# Patient Record
Sex: Female | Born: 2000
Health system: Southern US, Community
[De-identification: ages and names within clinical notes are randomized; demographics above are authoritative.]

## PROBLEM LIST (undated history)

## (undated) DIAGNOSIS — U071 COVID-19: Secondary | ICD-10-CM

## (undated) DIAGNOSIS — R42 Dizziness and giddiness: Secondary | ICD-10-CM

## (undated) DIAGNOSIS — T7840XA Allergy, unspecified, initial encounter: Secondary | ICD-10-CM

## (undated) DIAGNOSIS — L700 Acne vulgaris: Secondary | ICD-10-CM

## (undated) HISTORY — PX: WISDOM TOOTH EXTRACTION: SHX21

## (undated) HISTORY — DX: COVID-19: U07.1

## (undated) HISTORY — DX: Dizziness and giddiness: R42

## (undated) HISTORY — DX: Allergy, unspecified, initial encounter: T78.40XA

## (undated) HISTORY — DX: Acne vulgaris: L70.0

---
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
## 2018-04-15 DIAGNOSIS — L700 Acne vulgaris: Secondary | ICD-10-CM | POA: Diagnosis not present

## 2018-05-06 ENCOUNTER — Ambulatory Visit: Payer: 59 | Admitting: Internal Medicine

## 2018-05-06 ENCOUNTER — Encounter: Payer: Self-pay | Admitting: Internal Medicine

## 2018-05-06 VITALS — BP 110/68 | HR 95 | Temp 98.7°F | Ht 65.25 in | Wt 184.4 lb

## 2018-05-06 DIAGNOSIS — Z1159 Encounter for screening for other viral diseases: Secondary | ICD-10-CM | POA: Diagnosis not present

## 2018-05-06 DIAGNOSIS — E559 Vitamin D deficiency, unspecified: Secondary | ICD-10-CM

## 2018-05-06 DIAGNOSIS — Z3041 Encounter for surveillance of contraceptive pills: Secondary | ICD-10-CM

## 2018-05-06 DIAGNOSIS — Z1329 Encounter for screening for other suspected endocrine disorder: Secondary | ICD-10-CM | POA: Diagnosis not present

## 2018-05-06 DIAGNOSIS — L700 Acne vulgaris: Secondary | ICD-10-CM | POA: Diagnosis not present

## 2018-05-06 DIAGNOSIS — J309 Allergic rhinitis, unspecified: Secondary | ICD-10-CM | POA: Insufficient documentation

## 2018-05-06 DIAGNOSIS — R739 Hyperglycemia, unspecified: Secondary | ICD-10-CM | POA: Diagnosis not present

## 2018-05-06 DIAGNOSIS — E669 Obesity, unspecified: Secondary | ICD-10-CM | POA: Insufficient documentation

## 2018-05-06 DIAGNOSIS — Z0184 Encounter for antibody response examination: Secondary | ICD-10-CM

## 2018-05-06 DIAGNOSIS — Z1389 Encounter for screening for other disorder: Secondary | ICD-10-CM

## 2018-05-06 DIAGNOSIS — Z0000 Encounter for general adult medical examination without abnormal findings: Secondary | ICD-10-CM

## 2018-05-06 MED ORDER — TRI-LO-MARZIA 0.18/0.215/0.25 MG-25 MCG PO TABS: 1.0000 | ORAL_TABLET | Freq: Every day | ORAL | 11 refills | Status: DC

## 2018-05-06 NOTE — Progress Notes (Addendum)
Chief Complaint  Patient presents with  . New Patient (Initial Visit)   New patient with grandmother Iantha Fallen today  1. She has h/o allergies she knows she is allergic to grass but has never been allergy tested and grandmother and pt report she will get nasal congestion, ears congested and post nasal drip. She will try to hold nose and blow to help ear sx's. She is not currently taking allergy medication but has taken Zyrtec and Claritin in the past  2. Acne she has completed 6 months of accutane x 1 round last summer dermatologist is Dr. Claris Pong skin now she is on Tri Lo Robbie Louis for acne Rx by dermatology which is helping  3. Wisdom teeth recently 2019 removed and left posterior wisdom tooth area with whole in the gum and dentist told pt to rinse mouth with warm salt which she has been doing.     Review of Systems  Constitutional: Negative for weight loss.  HENT: Positive for congestion. Negative for ear pain and hearing loss.        +nasal and ear congestion   Eyes: Negative for blurred vision.  Respiratory: Negative for shortness of breath.   Cardiovascular: Negative for chest pain.  Gastrointestinal: Negative for abdominal pain and blood in stool.  Musculoskeletal: Negative for joint pain.  Skin: Negative for rash.       +acne improved   Neurological: Negative for dizziness.  Endo/Heme/Allergies: Positive for environmental allergies.  Psychiatric/Behavioral: Negative for depression.   Past Medical History:  Diagnosis Date  . Acne vulgaris    completed 6 months of Accutane summer 2018 x 1 round   . Allergy   . Vertigo    2017   Past Surgical History:  Procedure Laterality Date  . WISDOM TOOTH EXTRACTION     all 4 in 2019    Family History  Problem Relation Age of Onset  . Cancer Maternal Grandfather        smoker lung cancer   . Heart disease Maternal Grandfather        MI/CABG  . Dementia Maternal Grandfather    Social History   Socioeconomic  History  . Marital status: Single    Spouse name: Not on file  . Number of children: Not on file  . Years of education: Not on file  . Highest education level: Not on file  Occupational History  . Not on file  Social Needs  . Financial resource strain: Not on file  . Food insecurity:    Worry: Not on file    Inability: Not on file  . Transportation needs:    Medical: Not on file    Non-medical: Not on file  Tobacco Use  . Smoking status: Never Smoker  . Smokeless tobacco: Never Used  Substance and Sexual Activity  . Alcohol use: No  . Drug use: Not Currently  . Sexual activity: Not on file  Lifestyle  . Physical activity:    Days per week: Not on file    Minutes per session: Not on file  . Stress: Not on file  Relationships  . Social connections:    Talks on phone: Not on file    Gets together: Not on file    Attends religious service: Not on file    Active member of club or organization: Not on file    Attends meetings of clubs or organizations: Not on file    Relationship status: Not on file  . Intimate partner violence:  Fear of current or ex partner: Not on file    Emotionally abused: Not on file    Physically abused: Not on file    Forced sexual activity: Not on file  Other Topics Concern  . 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 TRI-LO-MARZIA 0.18/0.215/0.25 MG-25 MCG tab Take 1 tablet by mouth daily. As directed  . [DISCONTINUED] TRI-LO-MARZIA 0.18/0.215/0.25 MG-25 MCG tab Take 1 tablet by mouth daily.   No Known Allergies No results found for this or any previous visit (from the past 2160 hour(s)). Objective  Body mass index is 30.45 kg/m. Wt Readings from Last 3 Encounters:  05/06/18 184 lb 6.4 oz (83.6 kg) (96 %, Z= 1.70)*  08/31/16 160 lb (72.6 kg) (90 %, Z= 1.29)*    * Growth percentiles are based on CDC (Girls, 2-20 Years) data.   Temp Readings from Last 3 Encounters:  05/06/18 98.7 F (37.1 C) (Oral)  09/01/16 97.8 F (36.6 C) (Oral)   BP Readings from Last 3 Encounters:  05/06/18 110/68  09/01/16 (!) 93/57 (2 %, Z = -2.05 /  14 %, Z = -1.08)*   *BP percentiles are based on the August 2017 AAP Clinical Practice Guideline for girls   Pulse Readings from Last 3 Encounters:  05/06/18 95  09/01/16 81    Physical Exam  Constitutional: She is oriented to person, place, and time. Vital signs are normal. She appears well-developed and well-nourished. She is cooperative.  HENT:  Head: Normocephalic and atraumatic.  Right Ear: Hearing and tympanic membrane normal.  Left Ear: Hearing and tympanic membrane normal.  Mouth/Throat: Oropharynx is clear and moist and mucous membranes are normal.  Mild erythema b/l ears w/o pain pt reports cleans ears out with Qtips   Eyes: Pupils are equal, round, and reactive to light. Conjunctivae are normal.  Cardiovascular: Normal rate, regular rhythm and normal heart sounds.  Pulmonary/Chest: Effort normal and breath sounds normal.  Abdominal: Soft. Normal appearance and bowel sounds are normal. There is no tenderness.  Neurological: She is alert and oriented to person, place, and time. Gait normal.  Skin: Skin is warm, dry and intact.  Psychiatric: She has a normal mood and affect. Her speech is normal and behavior is normal. Judgment and thought content normal. Cognition and memory are normal.  Nursing note and vitals reviewed.   Assessment   1. Allergic rhinitis  2. Acne  3. S/p wisdom tooth extraction  4. HM/BMI 30.45  Plan   1.  Trial of OTC NS, Flonase, zyrtec or claritin or allegra at night  2. Cont OCP helping refilled today  3. F/u dentist  4.  Vaccines below  - hpv had 06/25/11 -Tdap had 06/26/11  -meningitis not listed on vaccines as had - varicella had 01/06/00, 04/26/02 - MMR had 01/06/00 and  12/13/03 -hep B had 02-Oct-1999, 02/03/99, 07/08/99  -hep A vaccine 01/25/2001, 03/21/2002  -hib had 03/03/99, 05/05/99, 07/08/99, 01/06/00  -had polip 03/03/99, 05/05/99, 07/08/99, 12/13/03  sch fasting labs  F/u before college   Reviewed records tb skin test had 04/12/16 negative    Dentist Dr. Valarie Merino   Provider: Dr. Olivia Mackie McLean-[facility]oc[facility]

## 2018-05-06 NOTE — Progress Notes (Signed)
Pre visit review using our clinic review tool, if applicable. No additional management support is needed unless otherwise documented below in the visit note. 

## 2018-05-06 NOTE — Patient Instructions (Addendum)
Try Nasal saline, Flonase, and Zyrtec or Claritin or Allegra at night  Schedule fasting labs w/in the next 1-2 weeks  Records of vaccination and blood levels of hepatitis B, MMR, Varicella, TB skin test Need record of Tdap, Menigitis vaccine, flu, hep B vaccine  Ill see see your before college   Dizziness Dizziness is a common problem. It is a feeling of unsteadiness or light-headedness. You may feel like you are about to faint. Dizziness can lead to injury if you stumble or fall. Anyone can become dizzy, but dizziness is more common in older adults. This condition can be caused by a number of things, including medicines, dehydration, or illness. Follow these instructions at home: Eating and drinking  Drink enough fluid to keep your urine clear or pale yellow. This helps to keep you from becoming dehydrated. Try to drink more clear fluids, such as water.  Do not drink alcohol.  Limit your caffeine intake if told to do so by your health care provider. Check ingredients and nutrition facts to see if a food or beverage contains caffeine.  Limit your salt (sodium) intake if told to do so by your health care provider. Check ingredients and nutrition facts to see if a food or beverage contains sodium. Activity  Avoid making quick movements. ? Rise slowly from chairs and steady yourself until you feel okay. ? In the morning, first sit up on the side of the bed. When you feel okay, stand slowly while you hold onto something until you know that your balance is fine.  If you need to stand in one place for a long time, move your legs often. Tighten and relax the muscles in your legs while you are standing.  Do not drive or use heavy machinery if you feel dizzy.  Avoid bending down if you feel dizzy. Place items in your home so that they are easy for you to reach without leaning over. Lifestyle  Do not use any products that contain nicotine or tobacco, such as cigarettes and e-cigarettes. If you  need help quitting, ask your health care provider.  Try to reduce your stress level by using methods such as yoga or meditation. Talk with your health care provider if you need help to manage your stress. General instructions  Watch your dizziness for any changes.  Take over-the-counter and prescription medicines only as told by your health care provider. Talk with your health care provider if you think that your dizziness is caused by a medicine that you are taking.  Tell a friend or a family member that you are feeling dizzy. If he or she notices any changes in your behavior, have this person call your health care provider.  Keep all follow-up visits as told by your health care provider. This is important. Contact a health care provider if:  Your dizziness does not go away.  Your dizziness or light-headedness gets worse.  You feel nauseous.  You have reduced hearing.  You have new symptoms.  You are unsteady on your feet or you feel like the room is spinning. Get help right away if:  You vomit or have diarrhea and are unable to eat or drink anything.  You have problems talking, walking, swallowing, or using your arms, hands, or legs.  You feel generally weak.  You are not thinking clearly or you have trouble forming sentences. It may take a friend or family member to notice this.  You have chest pain, abdominal pain, shortness of breath, or  sweating.  Your vision changes.  You have any bleeding.  You have a severe headache.  You have neck pain or a stiff neck.  You have a fever. These symptoms may represent a serious problem that is an emergency. Do not wait to see if the symptoms will go away. Get medical help right away. Call your local emergency services (911 in the [place] Do not drive yourself to the hospital. Summary  Dizziness is a feeling of unsteadiness or light-headedness. This condition can be caused by a number of things, including medicines, dehydration,  or illness.  Anyone can become dizzy, but dizziness is more common in older adults.  Drink enough fluid to keep your urine clear or pale yellow. Do not drink alcohol.  Avoid making quick movements if you feel dizzy. Monitor your dizziness for any changes. This information is not intended to replace advice given to you by your health care provider. Make sure you discuss any questions you have with your health care provider. Document Released: 05/05/2001 Document Revised: 12/12/2016 Document Reviewed: 12/12/2016 Elsevier Interactive Patient Education  2018 [employer].  Allergic Rhinitis, Adult Allergic rhinitis is an allergic reaction that affects the mucous membrane inside the nose. It causes sneezing, a runny or stuffy nose, and the feeling of mucus going down the back of the throat (postnasal drip). Allergic rhinitis can be mild to severe. There are two types of allergic rhinitis:  Seasonal. This type is also called hay fever. It happens only during certain seasons.  Perennial. This type can happen at any time of the year.  What are the causes? This condition happens when the body's defense system (immune system) responds to certain harmless substances called allergens as though they were germs.  Seasonal allergic rhinitis is triggered by pollen, which can come from grasses, trees, and weeds. Perennial allergic rhinitis may be caused by:  House dust mites.  Pet dander.  Mold spores.  What are the signs or symptoms? Symptoms of this condition include:  Sneezing.  Runny or stuffy nose (nasal congestion).  Postnasal drip.  Itchy nose.  Tearing of the eyes.  Trouble sleeping.  Daytime sleepiness.  How is this diagnosed? This condition may be diagnosed based on:  Your medical history.  A physical exam.  Tests to check for related conditions, such as: ? Asthma. ? Pink eye. ? Ear infection. ? Upper respiratory infection.  Tests to find out which allergens  trigger your symptoms. These may include skin or blood tests.  How is this treated? There is no cure for this condition, but treatment can help control symptoms. Treatment may include:  Taking medicines that block allergy symptoms, such as antihistamines. Medicine may be given as a shot, nasal spray, or pill.  Avoiding the allergen.  Desensitization. This treatment involves getting ongoing shots until your body becomes less sensitive to the allergen. This treatment may be done if other treatments do not help.  If taking medicine and avoiding the allergen does not work, new, stronger medicines may be prescribed.  Follow these instructions at home:  Find out what you are allergic to. Common allergens include smoke, dust, and pollen.  Avoid the things you are allergic to. These are some things you can do to help avoid allergens: ? Replace carpet with wood, tile, or vinyl flooring. Carpet can trap dander and dust. ? Do not smoke. Do not allow smoking in your home. ? Change your heating and air conditioning filter at least once a month. ? During allergy  season:  Keep windows closed as much as possible.  Plan outdoor activities when pollen counts are lowest. This is usually during the evening hours.  When coming indoors, change clothing and shower before sitting on furniture or bedding.  Take over-the-counter and prescription medicines only as told by your health care provider.  Keep all follow-up visits as told by your health care provider. This is important. Contact a health care provider if:  You have a fever.  You develop a persistent cough.  You make whistling sounds when you breathe (you wheeze).  Your symptoms interfere with your normal daily activities. Get help right away if:  You have shortness of breath. Summary  This condition can be managed by taking medicines as directed and avoiding allergens.  Contact your health care provider if you develop a persistent  cough or fever.  During allergy season, keep windows closed as much as possible. This information is not intended to replace advice given to you by your health care provider. Make sure you discuss any questions you have with your health care provider. Document Released: 08/04/2001 Document Revised: 12/17/2016 Document Reviewed: 12/17/2016 Elsevier Interactive Patient Education  2018 [place].  Debrox/Carbamide Peroxide ear solution What is this medicine? CARBAMIDE PEROXIDE (CAR bah mide per OX ide) is used to soften and help remove ear wax. This medicine may be used for other purposes; ask your health care provider or pharmacist if you have questions. COMMON BRAND NAME(S): Auro Ear, Auro Earache Relief, Debrox, Ear Drops, Ear Wax Removal, Ear Wax Remover, Earwax Treatment, Murine, Thera-Ear What should I tell my health care provider before I take this medicine? They need to know if you have any of these conditions: -dizziness -ear discharge -ear pain, irritation or rash -infection -perforated eardrum (hole in eardrum) -an unusual or allergic reaction to carbamide peroxide, glycerin, hydrogen peroxide, other medicines, foods, dyes, or preservatives -pregnant or trying to get pregnant -breast-feeding How should I use this medicine? This medicine is only for use in the outer ear canal. Follow the directions carefully. Wash hands before and after use. The solution may be warmed by holding the bottle in the hand for 1 to 2 minutes. Lie with the affected ear facing upward. Place the proper number of drops into the ear canal. After the drops are instilled, remain lying with the affected ear upward for 5 minutes to help the drops stay in the ear canal. A cotton ball may be gently inserted at the ear opening for no longer than 5 to 10 minutes to ensure retention. Repeat, if necessary, for the opposite ear. Do not touch the tip of the dropper to the ear, fingertips, or other surface. Do not rinse  the dropper after use. Keep container tightly closed. Talk to your pediatrician regarding the use of this medicine in children. While this drug may be used in children as young as 12 years for selected conditions, precautions do apply. Overdosage: If you think you have taken too much of this medicine contact a poison control center or emergency room at once. NOTE: This medicine is only for you. Do not share this medicine with others. What if I miss a dose? If you miss a dose, use it as soon as you can. If it is almost time for your next dose, use only that dose. Do not use double or extra doses. What may interact with this medicine? Interactions are not expected. Do not use any other ear products without asking your doctor or health care  professional. This list may not describe all possible interactions. Give your health care provider a list of all the medicines, herbs, non-prescription drugs, or dietary supplements you use. Also tell them if you smoke, drink alcohol, or use illegal drugs. Some items may interact with your medicine. What should I watch for while using this medicine? This medicine is not for long-term use. Do not use for more than 4 days without checking with your health care professional. Contact your doctor or health care professional if your condition does not start to get better within a few days or if you notice burning, redness, itching or swelling. What side effects may I notice from receiving this medicine? Side effects that you should report to your doctor or health care professional as soon as possible: -allergic reactions like skin rash, itching or hives, swelling of the face, lips, or tongue -burning, itching, and redness -worsening ear pain -rash Side effects that usually do not require medical attention (report to your doctor or health care professional if they continue or are bothersome): -abnormal sensation while putting the drops in the ear -temporary reduction in  hearing (but not complete loss of hearing) This list may not describe all possible side effects. Call your doctor for medical advice about side effects. You may report side effects to FDA at 1-800-FDA-1088. Where should I keep my medicine? Keep out of the reach of children. Store at room temperature between 15 and 30 degrees C (59 and 86 degrees F) in a tight, light-resistant container. Keep bottle away from excessive heat and direct sunlight. Throw away any unused medicine after the expiration date. NOTE: This sheet is a summary. It may not cover all possible information. If you have questions about this medicine, talk to your doctor, pharmacist, or health care provider.  2018 Elsevier/Gold Standard (2008-02-21 14:00:02)

## 2018-05-12 ENCOUNTER — Other Ambulatory Visit (INDEPENDENT_AMBULATORY_CARE_PROVIDER_SITE_OTHER): Payer: 59

## 2018-05-12 DIAGNOSIS — Z1159 Encounter for screening for other viral diseases: Secondary | ICD-10-CM | POA: Diagnosis not present

## 2018-05-12 DIAGNOSIS — Z0184 Encounter for antibody response examination: Secondary | ICD-10-CM

## 2018-05-12 DIAGNOSIS — Z0000 Encounter for general adult medical examination without abnormal findings: Secondary | ICD-10-CM | POA: Diagnosis not present

## 2018-05-12 DIAGNOSIS — Z1389 Encounter for screening for other disorder: Secondary | ICD-10-CM | POA: Diagnosis not present

## 2018-05-12 DIAGNOSIS — E559 Vitamin D deficiency, unspecified: Secondary | ICD-10-CM | POA: Diagnosis not present

## 2018-05-12 DIAGNOSIS — R739 Hyperglycemia, unspecified: Secondary | ICD-10-CM

## 2018-05-12 DIAGNOSIS — Z1329 Encounter for screening for other suspected endocrine disorder: Secondary | ICD-10-CM

## 2018-05-12 NOTE — Addendum Note (Signed)
Addended by: Arby Barrette on: 05/12/2018 09:32 AM   Modules accepted: Orders

## 2018-05-13 LAB — COMPREHENSIVE METABOLIC PANEL
AG RATIO: 1.6 (calc) (ref 1.0–2.5)
ALBUMIN MSPROF: 4.4 g/dL (ref 3.6–5.1)
ALT: 19 U/L (ref 5–32)
AST: 16 U/L (ref 12–32)
Alkaline phosphatase (APISO): 50 U/L (ref 47–176)
BILIRUBIN TOTAL: 0.3 mg/dL (ref 0.2–1.1)
BUN: 11 mg/dL (ref 7–20)
CALCIUM: 9.6 mg/dL (ref 8.9–10.4)
CO2: 19 mmol/L — AB (ref 20–32)
Chloride: 105 mmol/L (ref 98–110)
Creat: 0.84 mg/dL (ref 0.50–1.00)
GLOBULIN: 2.8 g/dL (ref 2.0–3.8)
Glucose, Bld: 93 mg/dL (ref 65–99)
POTASSIUM: 4.2 mmol/L (ref 3.8–5.1)
Sodium: 138 mmol/L (ref 135–146)
Total Protein: 7.2 g/dL (ref 6.3–8.2)

## 2018-05-13 LAB — CBC WITH DIFFERENTIAL/PLATELET
Basophils Absolute: 42 cells/uL (ref 0–200)
Basophils Relative: 0.7 %
EOS PCT: 1.5 %
Eosinophils Absolute: 90 cells/uL (ref 15–500)
HEMATOCRIT: 35.1 % (ref 35.0–45.0)
Hemoglobin: 12.1 g/dL (ref 11.7–15.5)
Lymphs Abs: 2148 cells/uL (ref 850–3900)
MCH: 30.3 pg (ref 27.0–33.0)
MCHC: 34.5 g/dL (ref 32.0–36.0)
MCV: 88 fL (ref 80.0–100.0)
MONOS PCT: 9 %
MPV: 9.3 fL (ref 7.5–12.5)
NEUTROS PCT: 53 %
Neutro Abs: 3180 cells/uL (ref 1500–7800)
Platelets: 371 10*3/uL (ref 140–400)
RBC: 3.99 10*6/uL (ref 3.80–5.10)
RDW: 12.7 % (ref 11.0–15.0)
Total Lymphocyte: 35.8 %
WBC mixed population: 540 cells/uL (ref 200–950)
WBC: 6 10*3/uL (ref 3.8–10.8)

## 2018-05-13 LAB — MICROSCOPIC EXAMINATION
CASTS: NONE SEEN /LPF
RBC, UA: NONE SEEN /hpf (ref 0–2)

## 2018-05-13 LAB — HEMOGLOBIN A1C
Hgb A1c MFr Bld: 5.1 % of total Hgb (ref <5.7)
MEAN PLASMA GLUCOSE: 100 (calc)
eAG (mmol/L): 5.5 (calc)

## 2018-05-13 LAB — URINALYSIS, ROUTINE W REFLEX MICROSCOPIC
Bilirubin, UA: NEGATIVE
Glucose, UA: NEGATIVE
Ketones, UA: NEGATIVE
LEUKOCYTES UA: NEGATIVE
Nitrite, UA: NEGATIVE
PH UA: 5.5 (ref 5.0–7.5)
PROTEIN UA: NEGATIVE
Specific Gravity, UA: 1.005 (ref 1.005–1.030)
Urobilinogen, Ur: 0.2 mg/dL (ref 0.2–1.0)

## 2018-05-13 LAB — VITAMIN D 25 HYDROXY (VIT D DEFICIENCY, FRACTURES): VIT D 25 HYDROXY: 33 ng/mL (ref 30–100)

## 2018-05-13 LAB — MEASLES/MUMPS/RUBELLA IMMUNITY
RUBEOLA IGG: 228 [AU]/ml
Rubella: 31.4 index

## 2018-05-13 LAB — T4, FREE: Free T4: 1.4 ng/dL (ref 0.8–1.4)

## 2018-05-13 LAB — TSH: TSH: 1.7 mIU/L

## 2018-05-13 LAB — HEPATITIS B SURFACE ANTIBODY, QUANTITATIVE

## 2018-05-19 ENCOUNTER — Encounter: Payer: Self-pay | Admitting: *Deleted

## 2018-06-02 IMAGING — CT CT HEAD W/O CM
3 series · 15 of 45 positions shown, 18 images · non-contrast
Comparison: None.

CLINICAL DATA: Syncope.  Nausea and vomiting.  Dizziness.

EXAM:
CT HEAD WITHOUT CONTRAST
TECHNIQUE: Contiguous axial images were obtained from the base of the skull
through the vertex without intravenous contrast.

[Series 2: head wo · axial · 0.40mm/px · z∈[+523,+638]mm · 9 of 28 slices shown, 12 images]
[im 3/28  brain]
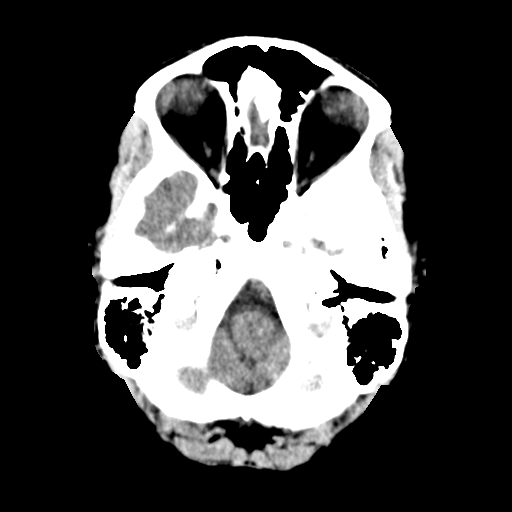
[im 3/28  bone]
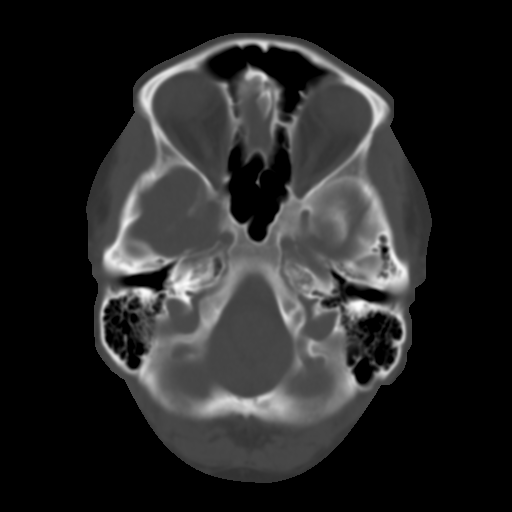
[im 6/28  brain]
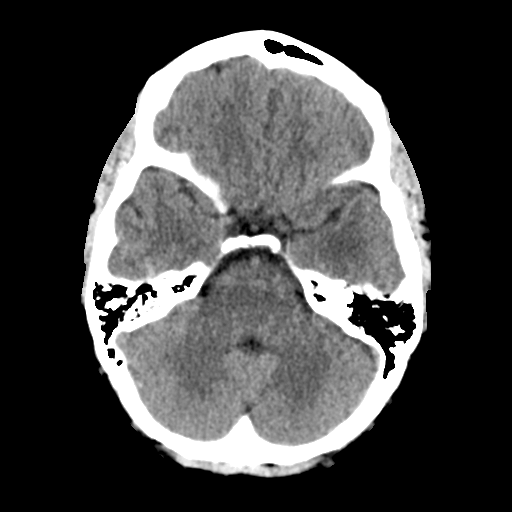
[im 9/28  brain]
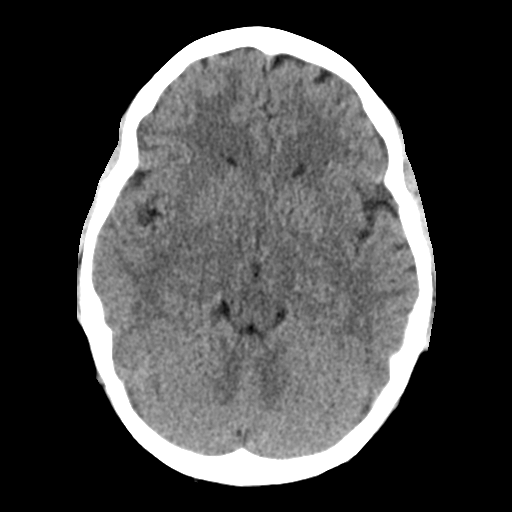
[im 12/28  brain]
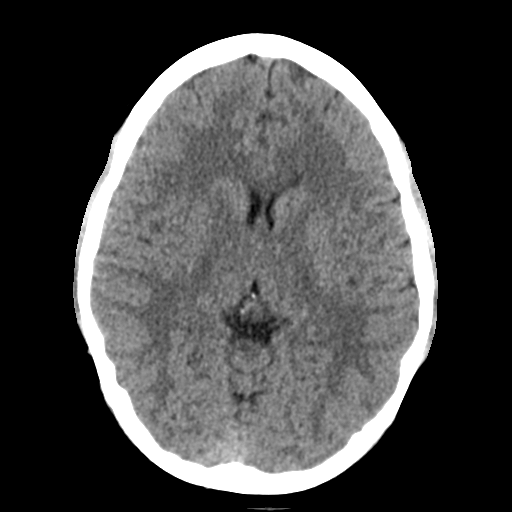
[im 15/28  brain]
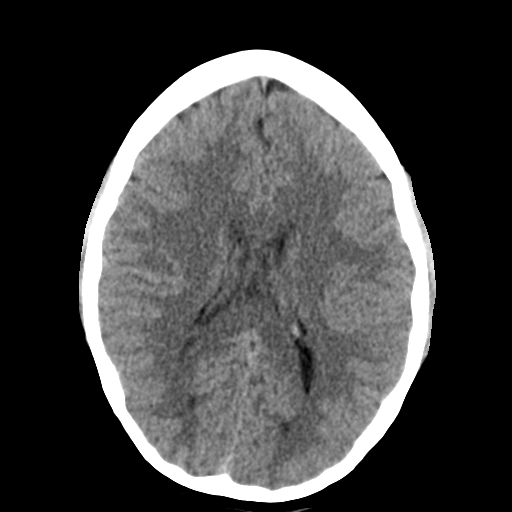
[im 15/28  bone]
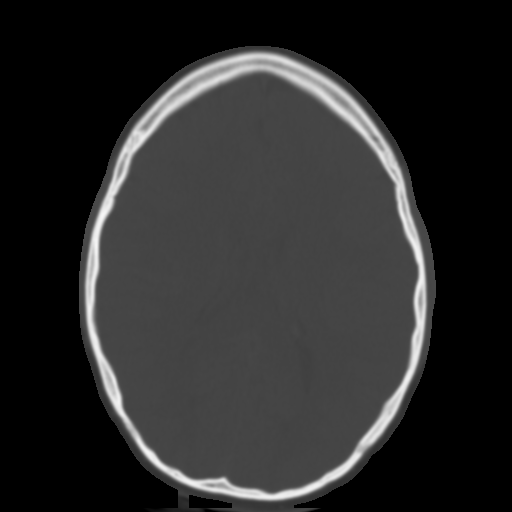
[im 17/28  brain]
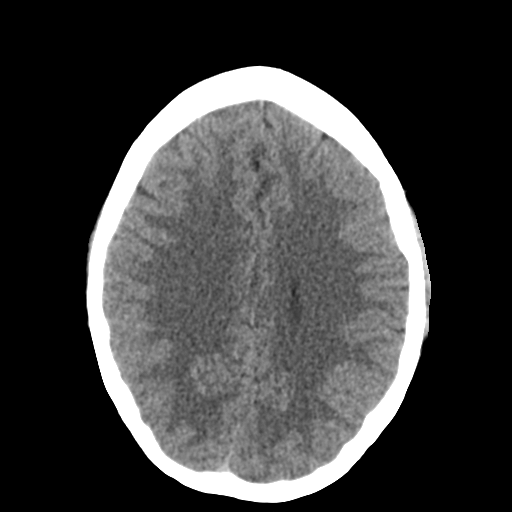
[im 20/28  brain]
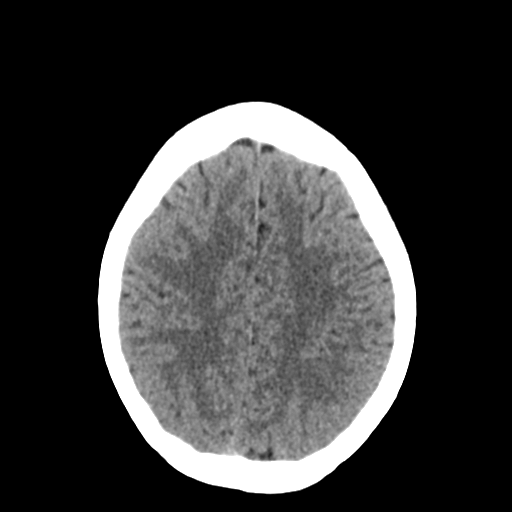
[im 23/28  brain]
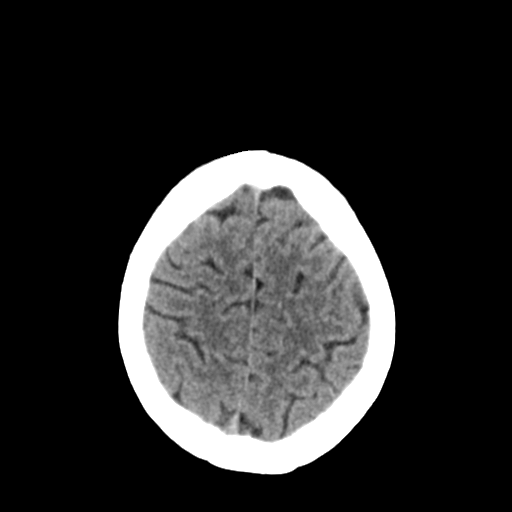
[im 26/28  brain]
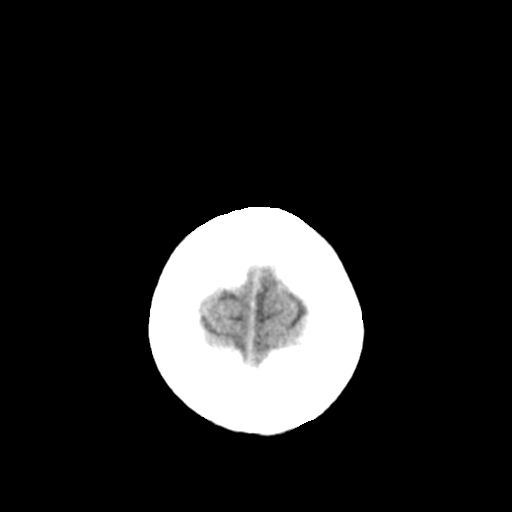
[im 26/28  bone]
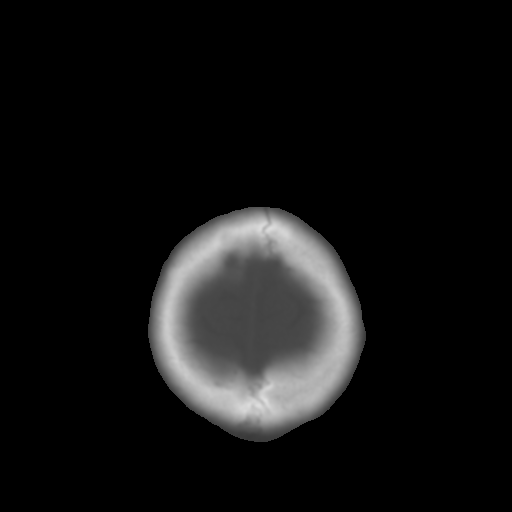

[Series 4: coronal soft tissue · coronal · 0.28mm/px · 3 of 62 slices shown]
[im 21/62  brain]
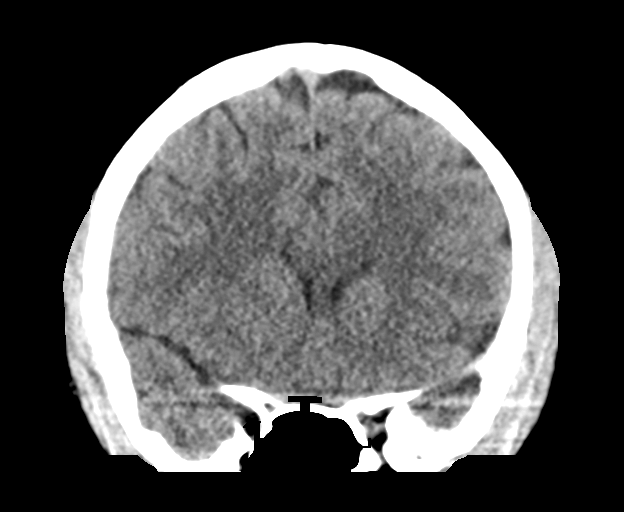
[im 28/62  brain]
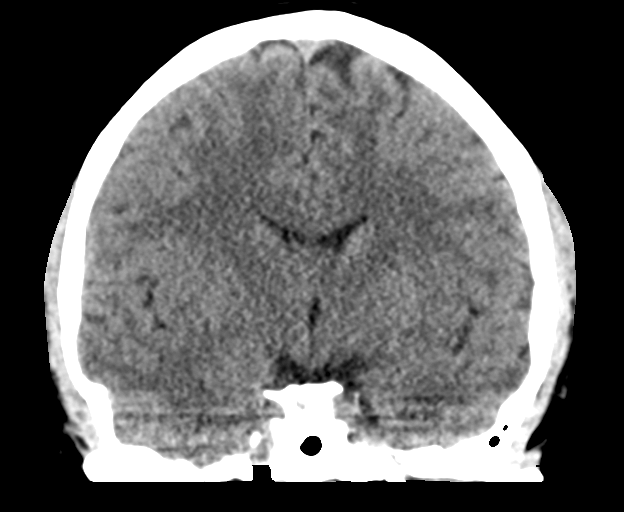
[im 34/62  brain]
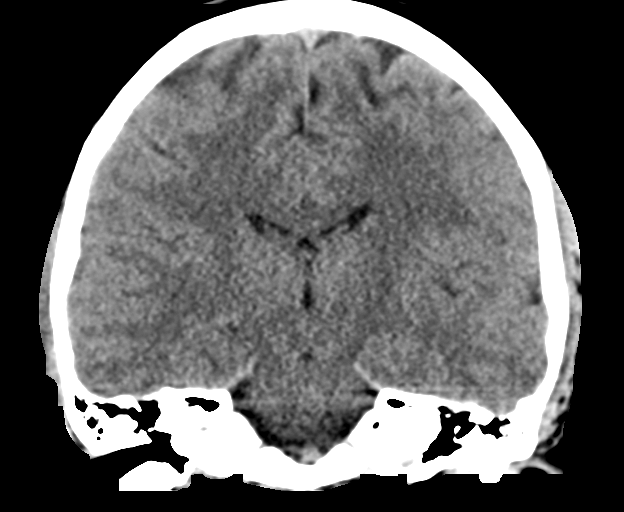

[Series 5: sagittal soft tissue · sagittal · 0.29mm/px · 3 of 50 slices shown]
[im 17/50  brain]
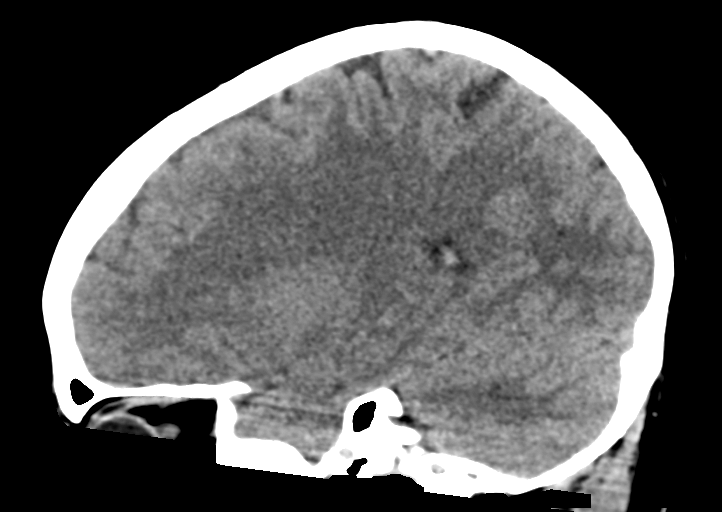
[im 25/50  brain]
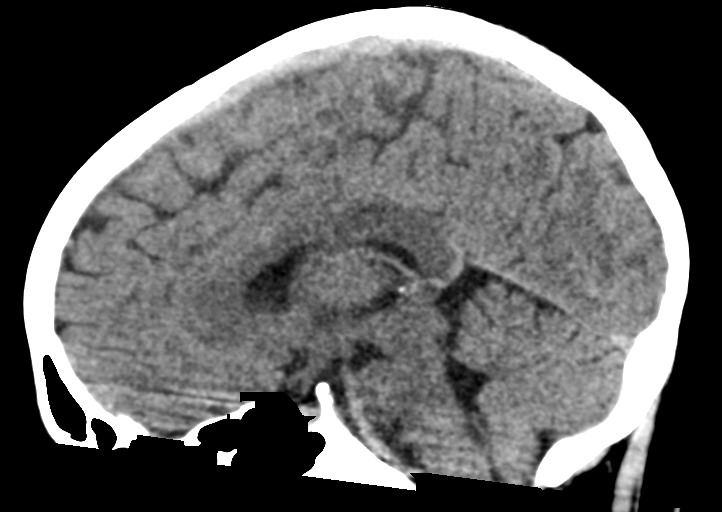
[im 33/50  brain]
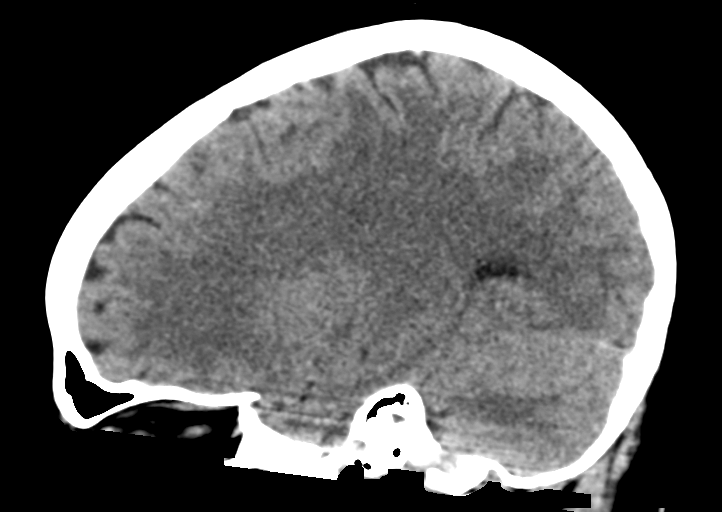

[15 of 45 positions shown; findings below may reference images not displayed]

FINDINGS: Brain: The brainstem, cerebellum, cerebral peduncles, thalami, basal
ganglia, basilar cisterns, and ventricular system appear within
normal limits. No intracranial hemorrhage, mass lesion, or acute
CVA.

Vascular: Unremarkable

Skull: Unremarkable

Sinuses/Orbits: Unremarkable

Other: No supplemental non-categorized findings.
IMPRESSION: 1. No significant intracranial abnormality is identified to explain
the patient's symptoms.

## 2018-06-30 ENCOUNTER — Ambulatory Visit: Payer: 59 | Admitting: Internal Medicine

## 2019-01-23 DIAGNOSIS — J209 Acute bronchitis, unspecified: Secondary | ICD-10-CM | POA: Diagnosis not present

## 2019-01-23 DIAGNOSIS — J111 Influenza due to unidentified influenza virus with other respiratory manifestations: Secondary | ICD-10-CM | POA: Diagnosis not present

## 2019-03-08 ENCOUNTER — Ambulatory Visit (INDEPENDENT_AMBULATORY_CARE_PROVIDER_SITE_OTHER): Payer: 59 | Admitting: Internal Medicine

## 2019-03-08 DIAGNOSIS — R197 Diarrhea, unspecified: Secondary | ICD-10-CM | POA: Diagnosis not present

## 2019-03-08 NOTE — Patient Instructions (Signed)
Bland Diet A bland diet consists of foods that are often soft and do not have a lot of fat, fiber, or extra seasonings. Foods without fat, fiber, or seasoning are easier for the body to digest. They are also less likely to irritate your mouth, throat, stomach, and other parts of your digestive system. A bland diet is sometimes called a BRAT diet. What is my plan? Your health care provider or food and nutrition specialist (dietitian) may recommend specific changes to your diet to prevent symptoms or to treat your symptoms. These changes may include:  Eating small meals often.  Cooking food until it is soft enough to chew easily.  Chewing your food well.  Drinking fluids slowly.  Not eating foods that are very spicy, sour, or fatty.  Not eating citrus fruits, such as oranges and grapefruit. What do I need to know about this diet?  Eat a variety of foods from the bland diet food list.  Do not follow a bland diet longer than needed.  Ask your health care provider whether you should take vitamins or supplements. What foods can I eat? Grains  Hot cereals, such as cream of wheat. Rice. Bread, crackers, or tortillas made from refined white flour. Vegetables Canned or cooked vegetables. Mashed or boiled potatoes. Fruits  Bananas. Applesauce. Other types of cooked or canned fruit with the skin and seeds removed, such as canned peaches or pears. Meats and other proteins  Scrambled eggs. Creamy peanut butter or other nut butters. Lean, well-cooked meats, such as chicken or fish. Tofu. Soups or broths. Dairy Low-fat dairy products, such as milk, cottage cheese, or yogurt. Beverages  Water. Herbal tea. Apple juice. Fats and oils Mild salad dressings. Canola or olive oil. Sweets and desserts Pudding. Custard. Fruit gelatin. Ice cream. The items listed above may not be a complete list of recommended foods and beverages. Contact a dietitian for more options. What foods are not  recommended? Grains Whole grain breads and cereals. Vegetables Raw vegetables. Fruits Raw fruits, especially citrus, berries, or dried fruits. Dairy Whole fat dairy foods. Beverages Caffeinated drinks. Alcohol. Seasonings and condiments Strongly flavored seasonings or condiments. Hot sauce. Salsa. Other foods Spicy foods. Fried foods. Sour foods, such as pickled or fermented foods. Foods with high sugar content. Foods high in fiber. The items listed above may not be a complete list of foods and beverages to avoid. Contact a dietitian for more information. Summary  A bland diet consists of foods that are often soft and do not have a lot of fat, fiber, or extra seasonings.  Foods without fat, fiber, or seasoning are easier for the body to digest.  Check with your health care provider to see how long you should follow this diet plan. It is not meant to be followed for long periods. This information is not intended to replace advice given to you by your health care provider. Make sure you discuss any questions you have with your health care provider. Document Released: 03/02/2016 Document Revised: 12/08/2017 Document Reviewed: 12/08/2017 Elsevier Interactive Patient Education  2019 Elsevier Inc.  Diarrhea, Adult Diarrhea is frequent loose and watery bowel movements. Diarrhea can make you feel weak and cause you to become dehydrated. Dehydration can make you tired and thirsty, cause you to have a dry mouth, and decrease how often you urinate. Diarrhea typically lasts 2-3 days. However, it can last longer if it is a sign of something more serious. It is important to treat your diarrhea as told by your  health care provider. Follow these instructions at home: Eating and drinking     Follow these recommendations as told by your health care provider:  Take an oral rehydration solution (ORS). This is an over-the-counter medicine that helps return your body to its normal balance of  nutrients and water. It is found at pharmacies and retail stores.  Drink plenty of fluids, such as water, ice chips, diluted fruit juice, and low-calorie sports drinks. You can drink milk also, if desired.  Avoid drinking fluids that contain a lot of sugar or caffeine, such as energy drinks, sports drinks, and soda.  Eat bland, easy-to-digest foods in small amounts as you are able. These foods include bananas, applesauce, rice, lean meats, toast, and crackers.  Avoid alcohol.  Avoid spicy or fatty foods.  Medicines  Take over-the-counter and prescription medicines only as told by your health care provider.  If you were prescribed an antibiotic medicine, take it as told by your health care provider. Do not stop using the antibiotic even if you start to feel better. General instructions   Wash your hands often using soap and water. If soap and water are not available, use a hand sanitizer. Others in the household should wash their hands as well. Hands should be washed: ? After using the toilet or changing a diaper. ? Before preparing, cooking, or serving food. ? While caring for a sick person or while visiting someone in a hospital.  Drink enough fluid to keep your urine pale yellow.  Rest at home while you recover.  Watch your condition for any changes.  Take a warm bath to relieve any burning or pain from frequent diarrhea episodes.  Keep all follow-up visits as told by your health care provider. This is important. Contact a health care provider if:  You have a fever.  Your diarrhea gets worse.  You have new symptoms.  You cannot keep fluids down.  You feel light-headed or dizzy.  You have a headache.  You have muscle cramps. Get help right away if:  You have chest pain.  You feel extremely weak or you faint.  You have bloody or black stools or stools that look like tar.  You have severe pain, cramping, or bloating in your abdomen.  You have trouble  breathing or you are breathing very quickly.  Your heart is beating very quickly.  Your skin feels cold and clammy.  You feel confused.  You have signs of dehydration, such as: ? Dark urine, very little urine, or no urine. ? Cracked lips. ? Dry mouth. ? Sunken eyes. ? Sleepiness. ? Weakness. Summary  Diarrhea is frequent loose and watery bowel movements. Diarrhea can make you feel weak and cause you to become dehydrated.  Drink enough fluids to keep your urine pale yellow.  Make sure that you wash your hands after using the toilet. If soap and water are not available, use hand sanitizer.  Contact a health care provider if your diarrhea gets worse or you have new symptoms.  Get help right away if you have signs of dehydration. This information is not intended to replace advice given to you by your health care provider. Make sure you discuss any questions you have with your health care provider. Document Released: 10/30/2002 Document Revised: 04/15/2018 Document Reviewed: 04/15/2018 Elsevier Interactive Patient Education  2019 [employer].

## 2019-03-08 NOTE — Progress Notes (Signed)
Virtual Visit via Video Note-Doxy  I connected with Martinique Fojtik   on 03/08/19 at  8:09 AM EDT by a video enabled telemedicine application and verified that I am speaking with the correct person using two identifiers.  Location patient: home Location provider:work  Persons participating in the virtual visit: patient, provider, pts grandmother   I discussed the limitations of evaluation and management by telemedicine and the availability of in person appointments. The patient expressed understanding and agreed to proceed.   HPI: Diarrhea x 2-3 weeks pudding like brown looser with abnormal smell 2-3/day yesterday more formed. She has been drinking more water and eating more dairy (which is newer), no fever, no abdominal pain but has gas more. She had Doxycycline the 1st of march due to being sick but no sick contacts. Nothing tried. No blood in stool. She has never had loose stools before.  She wants to know if stool needs to be tested. Pt has city water no well water no recent swimming in lakes.    ROS: See pertinent positives and negatives per HPI.  Past Medical History:  Diagnosis Date  . Acne vulgaris    completed 6 months of Accutane summer 2018 x 1 round   . Allergy   . Vertigo    2017    Past Surgical History:  Procedure Laterality Date  . WISDOM TOOTH EXTRACTION     all 4 in 2019     Family History  Problem Relation Age of Onset  . Cancer Maternal Grandfather        smoker lung cancer   . Heart disease Maternal Grandfather        MI/CABG  . Dementia Maternal Grandfather     SOCIAL HX: lives with mom and GM for now in college at [employer]   Current Outpatient Medications:  .  TRI-LO-MARZIA 0.18/0.215/0.25 MG-25 MCG tab, Take 1 tablet by mouth daily. As directed, Disp: 1 Package, Rfl: 11  EXAM:  VITALS per patient if applicable:  GENERAL: alert, oriented, appears well and in no acute distress  HEENT: atraumatic, conjunttiva clear, no obvious abnormalities on  inspection of external nose and ears  NECK: normal movements of the head and neck  LUNGS: on inspection no signs of respiratory distress, breathing rate appears normal, no obvious gross SOB, gasping or wheezing  CV: no obvious cyanosis  MS: moves all visible extremities without noticeable abnormality  PSYCH/NEURO: pleasant and cooperative, no obvious depression or anxiety, speech and thought processing grossly intact  ASSESSMENT AND PLAN:  Discussed the following assessment and plan:  Diarrhea, unspecified type  -rec food diary, reduce dairy, increase fiber, probiotics otc call back Friday to let me know how doing  -BRAT diet, can try reduce sugar, low calorie gatorade or pedialyte.  -if does not resolve consider stool testing culture, GI path panel but likely not needed for now   I discussed the assessment and treatment plan with the patient. The patient was provided an opportunity to ask questions and all were answered. The patient agreed with the plan and demonstrated an understanding of the instructions.   The patient was advised to call back or seek an in-person evaluation if the symptoms worsen or if the condition fails to improve as anticipated.  Time spent 15 minutes  Delorise Jackson, MD

## 2019-05-16 ENCOUNTER — Other Ambulatory Visit: Payer: Self-pay | Admitting: Internal Medicine

## 2019-05-16 DIAGNOSIS — L700 Acne vulgaris: Secondary | ICD-10-CM

## 2019-05-16 DIAGNOSIS — Z3041 Encounter for surveillance of contraceptive pills: Secondary | ICD-10-CM

## 2019-05-16 MED ORDER — TRI-LO-MARZIA 0.18/0.215/0.25 MG-25 MCG PO TABS: 1.0000 | ORAL_TABLET | Freq: Every day | ORAL | 4 refills | Status: DC

## 2019-06-08 ENCOUNTER — Telehealth: Payer: Self-pay

## 2019-06-08 ENCOUNTER — Ambulatory Visit: Payer: 59 | Admitting: Internal Medicine

## 2019-06-08 NOTE — Telephone Encounter (Signed)
Appt cancelled

## 2019-06-08 NOTE — Telephone Encounter (Signed)
Copied from [place] [id number]. Topic: Quick Communication - Appointment Cancellation >> Jun 08, 2019  8:48 AM Richardo Priest, NT wrote: Patient's grandmother called to cancel appointment scheduled for 7/16, due to patient being in class and did not know she had an appointment. Patient has not rescheduled their appointment and will call back to do so.   Route to department's PEC pool.

## 2020-04-18 ENCOUNTER — Telehealth: Payer: Self-pay | Admitting: Internal Medicine

## 2020-04-18 NOTE — Telephone Encounter (Signed)
Pt called to schedule an appt asap for mental change, anxiety and depression  Scheduled for 04/19/20 @ 11:30

## 2020-04-18 NOTE — Telephone Encounter (Signed)
Left message for patient to return call back.  

## 2020-04-19 ENCOUNTER — Encounter: Payer: Self-pay | Admitting: Internal Medicine

## 2020-04-19 ENCOUNTER — Other Ambulatory Visit: Payer: Self-pay

## 2020-04-19 ENCOUNTER — Ambulatory Visit: Payer: 59 | Admitting: Internal Medicine

## 2020-04-19 VITALS — BP 120/82 | HR 99 | Temp 98.1°F | Ht 65.25 in | Wt 218.0 lb

## 2020-04-19 DIAGNOSIS — F419 Anxiety disorder, unspecified: Secondary | ICD-10-CM | POA: Diagnosis not present

## 2020-04-19 DIAGNOSIS — E669 Obesity, unspecified: Secondary | ICD-10-CM

## 2020-04-19 DIAGNOSIS — Z1329 Encounter for screening for other suspected endocrine disorder: Secondary | ICD-10-CM | POA: Diagnosis not present

## 2020-04-19 DIAGNOSIS — F329 Major depressive disorder, single episode, unspecified: Secondary | ICD-10-CM

## 2020-04-19 DIAGNOSIS — Z13220 Encounter for screening for lipoid disorders: Secondary | ICD-10-CM

## 2020-04-19 DIAGNOSIS — Z1389 Encounter for screening for other disorder: Secondary | ICD-10-CM

## 2020-04-19 DIAGNOSIS — Z0000 Encounter for general adult medical examination without abnormal findings: Secondary | ICD-10-CM

## 2020-04-19 DIAGNOSIS — L650 Telogen effluvium: Secondary | ICD-10-CM | POA: Diagnosis not present

## 2020-04-19 DIAGNOSIS — F32A Depression, unspecified: Secondary | ICD-10-CM | POA: Insufficient documentation

## 2020-04-19 MED ORDER — CITALOPRAM HYDROBROMIDE 10 MG PO TABS: 10.0000 mg | ORAL_TABLET | Freq: Every day | ORAL | 3 refills | Status: DC

## 2020-04-19 NOTE — Progress Notes (Signed)
Chief Complaint  Patient presents with  . Anxiety    increasing steadly over the last 2 years. Pt states she feels like her heart is always racing anfd has been loosing hair.   . Hair/Scalp Problem    hair loss, possibly due to stress.    F/u with godmom Barbara  1. Anxiety increased with crying generalized anxiety worse w/in the last 1 year but since age 19 y.o and she has lack of interest GAD 7 score 18 and PHQ 9 score 19 extreme difficulty she reports she opens up to [facility] but not her mom and also declines therapy for now as she has hard time talking to people. She has had weight gain and hair loss. She tries to be perfect with everything   2. Hair loss nothing tried   Review of Systems  Constitutional: Negative for weight loss.  HENT: Negative for hearing loss.   Eyes: Negative for blurred vision.  Respiratory: Negative for shortness of breath.   Cardiovascular: Negative for chest pain.  Gastrointestinal: Negative for abdominal pain.  Skin: Negative for rash.       +hair loss  Psychiatric/Behavioral: The patient is nervous/anxious.    Past Medical History:  Diagnosis Date  . Acne vulgaris    completed 6 months of Accutane summer 2018 x 1 round   . Allergy   . Vertigo    2017   Past Surgical History:  Procedure Laterality Date  . WISDOM TOOTH EXTRACTION     all 4 in 2019    Family History  Problem Relation Age of Onset  . Cancer Maternal Grandfather        smoker lung cancer   . Heart disease Maternal Grandfather        MI/CABG  . Dementia Maternal Grandfather    Social History   Socioeconomic History  . Marital status: Single    Spouse name: Not on file  . Number of children: Not on file  . Years of education: Not on file  . Highest education level: Not on file  Occupational History  . Not on file  Tobacco Use  . Smoking status: Never Smoker  . Smokeless tobacco: Never Used  Substance and Sexual Activity  . Alcohol use: No  . Drug use: Not Currently  .  Sexual activity: Not on file  Other Topics Concern  . 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 Difficulty of Paying Living Expenses:   Food Insecurity:   . Worried About Charity fundraiser in the Last Year:   . Arboriculturist in the Last Year:   Transportation Needs:   . Film/video editor (Medical):   Marland Kitchen Lack of Transportation (Non-Medical):   Physical Activity:   . Days of Exercise per Week:   . Minutes of Exercise per Session:   Stress:   . Feeling of Stress :   Social Connections:   . Frequency of Communication with Friends and Family:   . Frequency of Social Gatherings with Friends and Family:   . Attends Religious Services:   . Active Member of Clubs or Organizations:   . Attends Archivist Meetings:   Marland Kitchen Marital Status:  Intimate Partner Violence:   . Fear of Current or Ex-Partner:   . Emotionally Abused:   Marland Kitchen Physically Abused:   . Sexually Abused:    Current Meds  Medication Sig  . TRI-LO-MARZIA 0.18/0.215/0.25 MG-25 MCG tab Take 1 tablet by mouth daily. As directed   No Known Allergies No results found for this or any previous visit (from the past 2160 hour(s)). Objective  Body mass index is 36 kg/m. Wt Readings from Last 3 Encounters:  04/19/20 218 lb (98.9 kg)  05/06/18 184 lb 6.4 oz (83.6 kg) (96 %, Z= 1.70)*  08/31/16 160 lb (72.6 kg) (90 %, Z= 1.29)*   * Growth percentiles are based on CDC (Girls, 2-20 Years) data.   Temp Readings from Last 3 Encounters:  04/19/20 98.1 F (36.7 C) (Temporal)  05/06/18 98.7 F (37.1 C) (Oral)  09/01/16 97.8 F (36.6 C) (Oral)   BP Readings from Last 3 Encounters:  04/19/20 120/82  05/06/18 110/68  09/01/16 (!) 93/57 (2 %, Z = -2.05 /  14 %, Z = -1.08)*   *BP percentiles  are based on the 2017 AAP Clinical Practice Guideline for girls   Pulse Readings from Last 3 Encounters:  04/19/20 99  05/06/18 95  09/01/16 81    Physical Exam Vitals and nursing note reviewed.  Constitutional:      Appearance: Normal appearance. She is well-developed and well-groomed. She is obese.  HENT:     Head: Normocephalic and atraumatic.  Eyes:     Conjunctiva/sclera: Conjunctivae normal.     Pupils: Pupils are equal, round, and reactive to light.  Cardiovascular:     Rate and Rhythm: Normal rate and regular rhythm.     Heart sounds: Normal heart sounds. No murmur.  Pulmonary:     Effort: Pulmonary effort is normal.     Breath sounds: Normal breath sounds.  Skin:    General: Skin is warm and dry.  Neurological:     General: No focal deficit present.     Mental Status: She is alert. Mental status is at baseline.     Gait: Gait normal.  Psychiatric:        Attention and Perception: Attention and perception normal.        Mood and Affect: Mood and affect normal.        Speech: Speech normal.        Behavior: Behavior normal. Behavior is cooperative.        Thought Content: Thought content normal.        Cognition and Memory: Cognition and memory normal.        Judgment: Judgment normal.     Assessment  Plan  Anxiety and depression - Plan: citalopram (CELEXA) 10 MG tablet Declines therapy for now  rec exercise and meditation call back if wants therapy  F/u in 4-6 weeks  Telogen effluvium rec stop pulling reduce stress  Obesity (BMI 30-39.9)  rec healthy diet and exercise   HM Vaccines below  -covid pfizer had 2/2 will bring card in future - hpv had 06/25/11 -Tdap had 06/26/11  -meningitis not listed on vaccines as had - varicella had 01/06/00, 04/26/02 - MMR had 01/06/00 and 12/13/03 -hep B had November 15, 1999, 02/03/99, 07/08/99  -hep A vaccine 01/25/2001, 03/21/2002  -hib had 03/03/99, 05/05/99, 07/08/99, 01/06/00  -had polip 03/03/99, 05/05/99, 07/08/99, 12/13/03  sch  fasting labs  F/u before college   Reviewed records tb skin test had 04/12/16 negative    Dentist Dr. Valarie Merino  Provider: Dr. Olivia Mackie McLean-Scocuzza-Internal Medicine

## 2020-04-19 NOTE — Telephone Encounter (Signed)
Patient seen in office today. 

## 2020-04-19 NOTE — Patient Instructions (Addendum)
Exercise 30-45 minutes  Apps  Replika, Bloom,  Insight Timer*, Calm, Headspace Aromatherapy Lavender/Eucalyptus  Multivitamin with iron  Hair skin and nails vitamin and/or biotin   Vital proteins strawberry lemonade  biosil drops whole foods/amazon Giovannis Tea Tree shampoo/conditioners   Telogen Effluvium  (web MD or [facility])   Citalopram tablets What is this medicine? CITALOPRAM (sye TAL oh pram) is a medicine for depression. This medicine may be used for other purposes; ask your health care provider or pharmacist if you have questions. COMMON BRAND NAME(S): Celexa What should I tell my health care provider before I take this medicine? They need to know if you have any of these conditions:  bleeding disorders  bipolar disorder or a family history of bipolar disorder  glaucoma  heart disease  history of irregular heartbeat  kidney disease  liver disease  low levels of magnesium or potassium in the blood  receiving electroconvulsive therapy  seizures  suicidal thoughts, plans, or attempt; a previous suicide attempt by you or a family member  take medicines that treat or prevent blood clots  thyroid disease  an unusual or allergic reaction to citalopram, escitalopram, other medicines, foods, dyes, or preservatives  pregnant or trying to become pregnant  breast-feeding How should I use this medicine? Take this medicine by mouth with a glass of water. Follow the directions on the prescription label. You can take it with or without food. Take your medicine at regular intervals. Do not take your medicine more often than directed. Do not stop taking this medicine suddenly except upon the advice of your doctor. Stopping this medicine too quickly may cause serious side effects or your condition may worsen. A special MedGuide will be given to you by the pharmacist with each prescription and refill. Be sure to read this information carefully each time. Talk to your  pediatrician regarding the use of this medicine in children. Special care may be needed. Patients over 16 years old may have a stronger reaction and need a smaller dose. Overdosage: If you think you have taken too much of this medicine contact a poison control center or emergency room at once. NOTE: This medicine is only for you. Do not share this medicine with others. What if I miss a dose? If you miss a dose, take it as soon as you can. If it is almost time for your next dose, take only that dose. Do not take double or extra doses. What may interact with this medicine? Do not take this medicine with any of the following medications:  certain medicines for fungal infections like fluconazole, itraconazole, ketoconazole, posaconazole, voriconazole  cisapride  dronedarone  escitalopram  linezolid  MAOIs like Carbex, Eldepryl, Marplan, Nardil, and Parnate  methylene blue (injected into a vein)  pimozide  thioridazine This medicine may also interact with the following medications:  alcohol  amphetamines  aspirin and aspirin-like medicines  carbamazepine  certain medicines for depression, anxiety, or psychotic disturbances  certain medicines for infections like chloroquine, clarithromycin, erythromycin, furazolidone, isoniazid, pentamidine  certain medicines for migraine headaches like almotriptan, eletriptan, frovatriptan, naratriptan, rizatriptan, sumatriptan, zolmitriptan  certain medicines for sleep  certain medicines that treat or prevent blood clots like dalteparin, enoxaparin, warfarin  cimetidine  diuretics  dofetilide  fentanyl  lithium  methadone  metoprolol  NSAIDs, medicines for pain and inflammation, like ibuprofen or naproxen  omeprazole  other medicines that prolong the QT interval (cause an abnormal heart rhythm)  procarbazine  rasagiline  supplements like [employer]. John's  wort, kava kava, valerian  tramadol  tryptophan  ziprasidone  This list may not describe all possible interactions. Give your health care provider a list of all the medicines, herbs, non-prescription drugs, or dietary supplements you use. Also tell them if you smoke, drink alcohol, or use illegal drugs. Some items may interact with your medicine. What should I watch for while using this medicine? Tell your doctor if your symptoms do not get better or if they get worse. Visit your doctor or health care professional for regular checks on your progress. Because it may take several weeks to see the full effects of this medicine, it is important to continue your treatment as prescribed by your doctor. Patients and their families should watch out for new or worsening thoughts of suicide or depression. Also watch out for sudden changes in feelings such as feeling anxious, agitated, panicky, irritable, hostile, aggressive, impulsive, severely restless, overly excited and hyperactive, or not being able to sleep. If this happens, especially at the beginning of treatment or after a change in dose, call your health care professional. Dennis Bast may get drowsy or dizzy. Do not drive, use machinery, or do anything that needs mental alertness until you know how this medicine affects you. Do not stand or sit up quickly, especially if you are an older patient. This reduces the risk of dizzy or fainting spells. Alcohol may interfere with the effect of this medicine. Avoid alcoholic drinks. Your mouth may get dry. Chewing sugarless gum or sucking hard candy, and drinking plenty of water will help. Contact your doctor if the problem does not go away or is severe. What side effects may I notice from receiving this medicine? Side effects that you should report to your doctor or health care professional as soon as possible:  allergic reactions like skin rash, itching or hives, swelling of the face, lips, or tongue  anxious  black, tarry stools  breathing problems  changes in vision   chest pain  confusion  elevated mood, decreased need for sleep, racing thoughts, impulsive behavior  eye pain  fast, irregular heartbeat  feeling faint or lightheaded, falls  feeling agitated, angry, or irritable  hallucination, loss of contact with reality  loss of balance or coordination  loss of memory  painful or prolonged erections  restlessness, pacing, inability to keep still  seizures  stiff muscles  suicidal thoughts or other mood changes  trouble sleeping  unusual bleeding or bruising  unusually weak or tired  vomiting Side effects that usually do not require medical attention (report to your doctor or health care professional if they continue or are bothersome):  change in appetite or weight  change in sex drive or performance  dizziness  headache  increased sweating  indigestion, nausea  tremors This list may not describe all possible side effects. Call your doctor for medical advice about side effects. You may report side effects to FDA at 1-800-FDA-1088. Where should I keep my medicine? Keep out of reach of children. Store at room temperature between 15 and 30 degrees C (59 and 86 degrees F). Throw away any unused medicine after the expiration date. NOTE: This sheet is a summary. It may not cover all possible information. If you have questions about this medicine, talk to your doctor, pharmacist, or health care provider.  2020 Elsevier/Gold Standard (2018-10-31 09:05:36)  Generalized Anxiety Disorder, Adult Generalized anxiety disorder (GAD) is a mental health disorder. People with this condition constantly worry about everyday events. Unlike normal anxiety, worry  related to GAD is not triggered by a specific event. These worries also do not fade or get better with time. GAD interferes with life functions, including relationships, work, and school. GAD can vary from mild to severe. People with severe GAD can have intense waves of anxiety  with physical symptoms (panic attacks). What are the causes? The exact cause of GAD is not known. What increases the risk? This condition is more likely to develop in:  Women.  People who have a family history of anxiety disorders.  People who are very shy.  People who experience very stressful life events, such as the death of a loved one.  People who have a very stressful family environment. What are the signs or symptoms? People with GAD often worry excessively about many things in their lives, such as their health and family. They may also be overly concerned about:  Doing well at work.  Being on time.  Natural disasters.  Friendships. Physical symptoms of GAD include:  Fatigue.  Muscle tension or having muscle twitches.  Trembling or feeling shaky.  Being easily startled.  Feeling like your heart is pounding or racing.  Feeling out of breath or like you cannot take a deep breath.  Having trouble falling asleep or staying asleep.  Sweating.  Nausea, diarrhea, or irritable bowel syndrome (IBS).  Headaches.  Trouble concentrating or remembering facts.  Restlessness.  Irritability. How is this diagnosed? Your health care provider can diagnose GAD based on your symptoms and medical history. You will also have a physical exam. The health care provider will ask specific questions about your symptoms, including how severe they are, when they started, and if they come and go. Your health care provider may ask you about your use of alcohol or drugs, including prescription medicines. Your health care provider may refer you to a mental health specialist for further evaluation. Your health care provider will do a thorough examination and may perform additional tests to rule out other possible causes of your symptoms. To be diagnosed with GAD, a person must have anxiety that:  Is out of his or her control.  Affects several different aspects of his or her life, such  as work and relationships.  Causes distress that makes him or her unable to take part in normal activities.  Includes at least three physical symptoms of GAD, such as restlessness, fatigue, trouble concentrating, irritability, muscle tension, or sleep problems. Before your health care provider can confirm a diagnosis of GAD, these symptoms must be present more days than they are not, and they must last for six months or longer. How is this treated? The following therapies are usually used to treat GAD:  Medicine. Antidepressant medicine is usually prescribed for long-term daily control. Antianxiety medicines may be added in severe cases, especially when panic attacks occur.  Talk therapy (psychotherapy). Certain types of talk therapy can be helpful in treating GAD by providing support, education, and guidance. Options include: ? Cognitive behavioral therapy (CBT). People learn coping skills and techniques to ease their anxiety. They learn to identify unrealistic or negative thoughts and behaviors and to replace them with positive ones. ? Acceptance and commitment therapy (ACT). This treatment teaches people how to be mindful as a way to cope with unwanted thoughts and feelings. ? Biofeedback. This process trains you to manage your body's response (physiological response) through breathing techniques and relaxation methods. You will work with a therapist while machines are used to monitor your physical symptoms.  Stress  management techniques. These include yoga, meditation, and exercise. A mental health specialist can help determine which treatment is best for you. Some people see improvement with one type of therapy. However, other people require a combination of therapies. Follow these instructions at home:  Take over-the-counter and prescription medicines only as told by your health care provider.  Try to maintain a normal routine.  Try to anticipate stressful situations and allow extra  time to manage them.  Practice any stress management or self-calming techniques as taught by your health care provider.  Do not punish yourself for setbacks or for not making progress.  Try to recognize your accomplishments, even if they are small.  Keep all follow-up visits as told by your health care provider. This is important. Contact a health care provider if:  Your symptoms do not get better.  Your symptoms get worse.  You have signs of depression, such as: ? A persistently sad, cranky, or irritable mood. ? Loss of enjoyment in activities that used to bring you joy. ? Change in weight or eating. ? Changes in sleeping habits. ? Avoiding friends or family members. ? Loss of energy for normal tasks. ? Feelings of guilt or worthlessness. Get help right away if:  You have serious thoughts about hurting yourself or others. If you ever feel like you may hurt yourself or others, or have thoughts about taking your own life, get help right away. You can go to your nearest emergency department or call:  Your local emergency services (911 in the [place]  A suicide crisis helpline, such as the [facility] at 770-135-5361. This is open 24 hours a day. Summary  Generalized anxiety disorder (GAD) is a mental health disorder that involves worry that is not triggered by a specific event.  People with GAD often worry excessively about many things in their lives, such as their health and family.  GAD may cause physical symptoms such as restlessness, trouble concentrating, sleep problems, frequent sweating, nausea, diarrhea, headaches, and trembling or muscle twitching.  A mental health specialist can help determine which treatment is best for you. Some people see improvement with one type of therapy. However, other people require a combination of therapies. This information is not intended to replace advice given to you by your health care provider. Make sure you  discuss any questions you have with your health care provider. Document Revised: 10/22/2017 Document Reviewed: 09/29/2016 Elsevier Patient Education  2020 [employer].

## 2020-04-23 ENCOUNTER — Telehealth: Payer: Self-pay | Admitting: Internal Medicine

## 2020-04-23 NOTE — Telephone Encounter (Signed)
Send my chart link to activate my chart  Thanks [place]

## 2020-04-23 NOTE — Telephone Encounter (Signed)
Link has been sent to patient via text.

## 2020-05-17 ENCOUNTER — Other Ambulatory Visit (INDEPENDENT_AMBULATORY_CARE_PROVIDER_SITE_OTHER): Payer: 59

## 2020-05-17 ENCOUNTER — Other Ambulatory Visit: Payer: Self-pay

## 2020-05-17 DIAGNOSIS — F32A Depression, unspecified: Secondary | ICD-10-CM

## 2020-05-17 DIAGNOSIS — Z0000 Encounter for general adult medical examination without abnormal findings: Secondary | ICD-10-CM

## 2020-05-17 DIAGNOSIS — Z1389 Encounter for screening for other disorder: Secondary | ICD-10-CM

## 2020-05-17 DIAGNOSIS — F329 Major depressive disorder, single episode, unspecified: Secondary | ICD-10-CM

## 2020-05-17 DIAGNOSIS — Z1329 Encounter for screening for other suspected endocrine disorder: Secondary | ICD-10-CM | POA: Diagnosis not present

## 2020-05-17 DIAGNOSIS — F419 Anxiety disorder, unspecified: Secondary | ICD-10-CM | POA: Diagnosis not present

## 2020-05-17 DIAGNOSIS — Z13220 Encounter for screening for lipoid disorders: Secondary | ICD-10-CM

## 2020-05-17 LAB — LIPID PANEL
Cholesterol: 197 mg/dL (ref 0–200)
HDL: 46.1 mg/dL (ref >39.00)
LDL Cholesterol: 133 mg/dL — ABNORMAL HIGH (ref 0–99)
NonHDL: 150.85
Total CHOL/HDL Ratio: 4
Triglycerides: 91 mg/dL (ref 0.0–149.0)
VLDL: 18.2 mg/dL (ref 0.0–40.0)

## 2020-05-17 LAB — CBC WITH DIFFERENTIAL/PLATELET
Basophils Absolute: 0 10*3/uL (ref 0.0–0.1)
Basophils Relative: 0.5 % (ref 0.0–3.0)
Eosinophils Absolute: 0.2 10*3/uL (ref 0.0–0.7)
Eosinophils Relative: 3.1 % (ref 0.0–5.0)
HCT: 36 % (ref 36.0–46.0)
Hemoglobin: 12.5 g/dL (ref 12.0–15.0)
Lymphocytes Relative: 36.7 % (ref 12.0–46.0)
Lymphs Abs: 2.1 10*3/uL (ref 0.7–4.0)
MCHC: 34.6 g/dL (ref 30.0–36.0)
MCV: 93.4 fl (ref 78.0–100.0)
Monocytes Absolute: 0.5 10*3/uL (ref 0.1–1.0)
Monocytes Relative: 8.4 % (ref 3.0–12.0)
Neutro Abs: 3 10*3/uL (ref 1.4–7.7)
Neutrophils Relative %: 51.3 % (ref 43.0–77.0)
Platelets: 292 10*3/uL (ref 150.0–400.0)
RBC: 3.86 Mil/uL — ABNORMAL LOW (ref 3.87–5.11)
RDW: 12.8 % (ref 11.5–15.5)
WBC: 5.9 10*3/uL (ref 4.0–10.5)

## 2020-05-17 LAB — COMPREHENSIVE METABOLIC PANEL
ALT: 53 U/L — ABNORMAL HIGH (ref 0–35)
AST: 134 U/L — ABNORMAL HIGH (ref 0–37)
Albumin: 4.7 g/dL (ref 3.5–5.2)
Alkaline Phosphatase: 46 U/L (ref 39–117)
BUN: 9 mg/dL (ref 6–23)
CO2: 22 mEq/L (ref 19–32)
Calcium: 9.6 mg/dL (ref 8.4–10.5)
Chloride: 102 mEq/L (ref 96–112)
Creatinine, Ser: 0.77 mg/dL (ref 0.40–1.20)
GFR: 94.29 mL/min (ref >60.00)
Glucose, Bld: 83 mg/dL (ref 70–99)
Potassium: 3.7 mEq/L (ref 3.5–5.1)
Sodium: 139 mEq/L (ref 135–145)
Total Bilirubin: 0.5 mg/dL (ref 0.2–1.2)
Total Protein: 7.1 g/dL (ref 6.0–8.3)

## 2020-05-17 LAB — TSH: TSH: 2.55 u[IU]/mL (ref 0.35–4.50)

## 2020-05-17 LAB — T4, FREE: Free T4: 1.04 ng/dL (ref 0.60–1.60)

## 2020-05-18 LAB — URINALYSIS, ROUTINE W REFLEX MICROSCOPIC
Bacteria, UA: NONE SEEN /HPF
Bilirubin Urine: NEGATIVE
Glucose, UA: NEGATIVE
Hyaline Cast: NONE SEEN /LPF
Leukocytes,Ua: NEGATIVE
Nitrite: NEGATIVE
Protein, ur: NEGATIVE
Specific Gravity, Urine: 1.012 (ref 1.001–1.03)
pH: 5.5 (ref 5.0–8.0)

## 2020-06-16 ENCOUNTER — Other Ambulatory Visit: Payer: Self-pay | Admitting: Internal Medicine

## 2020-06-16 DIAGNOSIS — L700 Acne vulgaris: Secondary | ICD-10-CM

## 2020-06-16 DIAGNOSIS — Z3041 Encounter for surveillance of contraceptive pills: Secondary | ICD-10-CM

## 2020-06-16 MED ORDER — TRI-LO-MARZIA 0.18/0.215/0.25 MG-25 MCG PO TABS: 1.0000 | ORAL_TABLET | Freq: Every day | ORAL | 3 refills | Status: DC

## 2020-08-29 DIAGNOSIS — R0981 Nasal congestion: Secondary | ICD-10-CM | POA: Diagnosis not present

## 2020-08-29 DIAGNOSIS — Z20822 Contact with and (suspected) exposure to covid-19: Secondary | ICD-10-CM | POA: Diagnosis not present

## 2020-08-29 DIAGNOSIS — J029 Acute pharyngitis, unspecified: Secondary | ICD-10-CM | POA: Diagnosis not present

## 2020-08-29 DIAGNOSIS — J069 Acute upper respiratory infection, unspecified: Secondary | ICD-10-CM | POA: Diagnosis not present

## 2020-09-28 DIAGNOSIS — R448 Other symptoms and signs involving general sensations and perceptions: Secondary | ICD-10-CM | POA: Diagnosis not present

## 2020-09-28 DIAGNOSIS — R0981 Nasal congestion: Secondary | ICD-10-CM | POA: Diagnosis not present

## 2020-09-28 DIAGNOSIS — J029 Acute pharyngitis, unspecified: Secondary | ICD-10-CM | POA: Diagnosis not present

## 2020-09-28 DIAGNOSIS — H6983 Other specified disorders of Eustachian tube, bilateral: Secondary | ICD-10-CM | POA: Diagnosis not present

## 2020-09-28 DIAGNOSIS — Z20822 Contact with and (suspected) exposure to covid-19: Secondary | ICD-10-CM | POA: Diagnosis not present

## 2021-05-26 ENCOUNTER — Other Ambulatory Visit: Payer: Self-pay | Admitting: Internal Medicine

## 2021-05-26 DIAGNOSIS — Z3041 Encounter for surveillance of contraceptive pills: Secondary | ICD-10-CM

## 2021-05-26 DIAGNOSIS — L700 Acne vulgaris: Secondary | ICD-10-CM

## 2021-07-03 ENCOUNTER — Other Ambulatory Visit: Payer: Self-pay | Admitting: Internal Medicine

## 2021-07-03 ENCOUNTER — Telehealth: Payer: Self-pay

## 2021-07-03 ENCOUNTER — Telehealth: Payer: Self-pay | Admitting: Internal Medicine

## 2021-07-03 DIAGNOSIS — Z3041 Encounter for surveillance of contraceptive pills: Secondary | ICD-10-CM

## 2021-07-03 DIAGNOSIS — L700 Acne vulgaris: Secondary | ICD-10-CM

## 2021-07-03 MED ORDER — NORGESTIM-ETH ESTRAD TRIPHASIC 0.18/0.215/0.25 MG-25 MCG PO TABS: 1.0000 | ORAL_TABLET | Freq: Every day | ORAL | 0 refills | Status: AC

## 2021-07-03 NOTE — Telephone Encounter (Signed)
Sch appt am for labs fasting and , md appt for physical with PAP

## 2021-07-03 NOTE — Telephone Encounter (Signed)
Lvm Sch appt am for labs fasting and , md appt for physical with PAP

## 2021-09-16 ENCOUNTER — Telehealth: Payer: Self-pay | Admitting: Internal Medicine

## 2021-09-16 NOTE — Telephone Encounter (Signed)
Patient informed, Due to the high volume of calls and your symptoms we have to forward your call to our Triage Nurse to expedient your call. Please hold for the transfer.  Patient transferred to [facility] at Access Nurse. Due to having a positive pcr COVID test this morning with symptoms of feeling hot,fatigue and stuffed up.No openings in office or virtual for the patient to be evaluated.

## 2021-09-16 NOTE — Telephone Encounter (Signed)
Called to speak with Amanda Schroeder. Pt states that she tested positive the morning of 09/16/21. Pt c/o hot flashes with no temperature, fatigue, and congestion. Symptoms started on 09/14/21. Pt spoke to a triage nurse and was instructed to rest at home and that antivirals are not necessary at this time.  Pt voiced understanding and plans on resting. Pt asks if there is any OTC medication that can be used for her symptoms. Pt states that she is going to be re-evaluated on 09/18/21 for a covid retest. Pt declined going to an urgent care for her symptoms at this time.

## 2021-09-17 NOTE — Telephone Encounter (Signed)
Received a verbal from Dr. Orland Mustard to use the dot phrase . [phone] for a list of patient medications that can be given to the patient concerning covid symptoms. Spoke to patients mother Amanda Schroeder and gave the medication names. Amanda Schroeder voiced no concerns or had any questions.   Dot Phrase reads as follows:  If needing prescription strength medication we will need to make an appointment with a provider.  These are over the counter medication options:  Mucinex dm green label for cough.  Vitamin C 1000 mg daily.  Vitamin D3 4000 Iu (units) daily.  Zinc 100 mg daily.  Quercetin 250-500 mg 2 times per day   Elderberry  Oil of oregano  cepacol or chloroseptic spray  Warm tea with honey and lemon  Hydration  Try to eat though you dont feel like it   Tylenol or Advil  Nasal saline  Flonase     Monitor pulse oximeter, buy from [place] if oxygen is less than 90 please go to the hospital.        Are you feeling really sick? Shortness of breath, cough, chest pain?, dizziness? Confusion   If so let me know  If worsening, go to hospital or [facility] Urgent care for further treatment.

## 2021-09-18 ENCOUNTER — Encounter: Payer: Self-pay | Admitting: Internal Medicine

## 2022-01-30 ENCOUNTER — Other Ambulatory Visit: Payer: Self-pay | Admitting: Internal Medicine

## 2022-01-30 DIAGNOSIS — Z3041 Encounter for surveillance of contraceptive pills: Secondary | ICD-10-CM

## 2022-01-30 DIAGNOSIS — L700 Acne vulgaris: Secondary | ICD-10-CM
